# Patient Record
Sex: Female | Born: 1980 | Race: White | Hispanic: No | Smoking: Current every day smoker
Health system: Southern US, Community
[De-identification: ages and names within clinical notes are randomized; demographics above are authoritative.]

## PROBLEM LIST (undated history)

## (undated) DIAGNOSIS — K219 Gastro-esophageal reflux disease without esophagitis: Secondary | ICD-10-CM

## (undated) DIAGNOSIS — F32A Depression, unspecified: Secondary | ICD-10-CM

## (undated) DIAGNOSIS — M546 Pain in thoracic spine: Secondary | ICD-10-CM

## (undated) DIAGNOSIS — G8929 Other chronic pain: Secondary | ICD-10-CM

## (undated) DIAGNOSIS — F419 Anxiety disorder, unspecified: Secondary | ICD-10-CM

## (undated) DIAGNOSIS — J40 Bronchitis, not specified as acute or chronic: Secondary | ICD-10-CM

## (undated) DIAGNOSIS — F329 Major depressive disorder, single episode, unspecified: Secondary | ICD-10-CM

## (undated) DIAGNOSIS — G894 Chronic pain syndrome: Secondary | ICD-10-CM

## (undated) DIAGNOSIS — I10 Essential (primary) hypertension: Secondary | ICD-10-CM

## (undated) DIAGNOSIS — R569 Unspecified convulsions: Secondary | ICD-10-CM

## (undated) DIAGNOSIS — Z720 Tobacco use: Secondary | ICD-10-CM

## (undated) DIAGNOSIS — G43909 Migraine, unspecified, not intractable, without status migrainosus: Secondary | ICD-10-CM

## (undated) DIAGNOSIS — M1990 Unspecified osteoarthritis, unspecified site: Secondary | ICD-10-CM

## (undated) DIAGNOSIS — Z9114 Patient's other noncompliance with medication regimen: Secondary | ICD-10-CM

## (undated) DIAGNOSIS — M549 Dorsalgia, unspecified: Secondary | ICD-10-CM

## (undated) HISTORY — PX: TUBAL LIGATION: SHX77

## (undated) HISTORY — PX: KNEE ARTHROPLASTY: SHX992

## (undated) HISTORY — DX: Unspecified convulsions: R56.9

---

## 2010-01-22 ENCOUNTER — Encounter
Admission: RE | Admit: 2010-01-22 | Discharge: 2010-04-22 | Payer: Self-pay | Source: Home / Self Care | Admitting: Physician Assistant

## 2010-05-19 ENCOUNTER — Emergency Department (HOSPITAL_COMMUNITY): Admission: EM | Admit: 2010-05-19 | Discharge: 2010-05-19 | Payer: Self-pay | Admitting: Emergency Medicine

## 2010-11-03 ENCOUNTER — Emergency Department (HOSPITAL_COMMUNITY): Payer: Medicaid Other

## 2010-11-03 ENCOUNTER — Emergency Department (HOSPITAL_COMMUNITY)
Admission: EM | Admit: 2010-11-03 | Discharge: 2010-11-03 | Disposition: A | Discharge status: Home / Self Care | Payer: Medicaid Other | Attending: Emergency Medicine | Admitting: Emergency Medicine

## 2010-11-03 DIAGNOSIS — K219 Gastro-esophageal reflux disease without esophagitis: Secondary | ICD-10-CM | POA: Insufficient documentation

## 2010-11-03 DIAGNOSIS — S0003XA Contusion of scalp, initial encounter: Secondary | ICD-10-CM | POA: Insufficient documentation

## 2010-11-03 DIAGNOSIS — IMO0002 Reserved for concepts with insufficient information to code with codable children: Secondary | ICD-10-CM | POA: Insufficient documentation

## 2016-02-25 ENCOUNTER — Emergency Department (HOSPITAL_COMMUNITY)
Admission: EM | Admit: 2016-02-25 | Discharge: 2016-02-25 | Disposition: A | Discharge status: Home / Self Care | Payer: Medicaid Other | Attending: Emergency Medicine | Admitting: Emergency Medicine

## 2016-02-25 ENCOUNTER — Encounter (HOSPITAL_COMMUNITY): Payer: Self-pay

## 2016-02-25 DIAGNOSIS — J189 Pneumonia, unspecified organism: Secondary | ICD-10-CM

## 2016-02-25 DIAGNOSIS — Z79891 Long term (current) use of opiate analgesic: Secondary | ICD-10-CM | POA: Insufficient documentation

## 2016-02-25 DIAGNOSIS — F17210 Nicotine dependence, cigarettes, uncomplicated: Secondary | ICD-10-CM | POA: Diagnosis not present

## 2016-02-25 DIAGNOSIS — Z79899 Other long term (current) drug therapy: Secondary | ICD-10-CM | POA: Insufficient documentation

## 2016-02-25 DIAGNOSIS — M1990 Unspecified osteoarthritis, unspecified site: Secondary | ICD-10-CM | POA: Diagnosis not present

## 2016-02-25 DIAGNOSIS — M546 Pain in thoracic spine: Secondary | ICD-10-CM

## 2016-02-25 DIAGNOSIS — F329 Major depressive disorder, single episode, unspecified: Secondary | ICD-10-CM | POA: Insufficient documentation

## 2016-02-25 HISTORY — DX: Dorsalgia, unspecified: M54.9

## 2016-02-25 HISTORY — DX: Unspecified convulsions: R56.9

## 2016-02-25 HISTORY — DX: Major depressive disorder, single episode, unspecified: F32.9

## 2016-02-25 HISTORY — DX: Unspecified osteoarthritis, unspecified site: M19.90

## 2016-02-25 HISTORY — DX: Pain in thoracic spine: M54.6

## 2016-02-25 HISTORY — DX: Chronic pain syndrome: G89.4

## 2016-02-25 HISTORY — DX: Migraine, unspecified, not intractable, without status migrainosus: G43.909

## 2016-02-25 HISTORY — DX: Patient's other noncompliance with medication regimen: Z91.14

## 2016-02-25 HISTORY — DX: Depression, unspecified: F32.A

## 2016-02-25 HISTORY — DX: Bronchitis, not specified as acute or chronic: J40

## 2016-02-25 HISTORY — DX: Tobacco use: Z72.0

## 2016-02-25 HISTORY — DX: Gastro-esophageal reflux disease without esophagitis: K21.9

## 2016-02-25 HISTORY — DX: Other chronic pain: G89.29

## 2016-02-25 HISTORY — DX: Anxiety disorder, unspecified: F41.9

## 2016-02-25 MED ORDER — CYCLOBENZAPRINE HCL 5 MG PO TABS: 5.0000 mg | ORAL_TABLET | Freq: Three times a day (TID) | ORAL | Status: DC | PRN

## 2016-02-25 MED ORDER — KETOROLAC TROMETHAMINE 30 MG/ML IJ SOLN
60.0000 mg | Freq: Once | INTRAMUSCULAR | Status: AC
  Administered 2016-02-25: 60 mg via INTRAMUSCULAR
  Filled 2016-02-25: qty 2

## 2016-02-25 MED ORDER — KETOROLAC TROMETHAMINE 30 MG/ML IJ SOLN
INTRAMUSCULAR | Status: AC
  Filled 2016-02-25: qty 1

## 2016-02-25 MED ORDER — NAPROXEN 500 MG PO TABS: ORAL_TABLET | ORAL | Status: DC

## 2016-02-25 NOTE — Discharge Instructions (Signed)
Continue using ice and heat on your back. You need to start your antibiotics. Your CAT scan was concerning for possible infection in your lung. You can take Mucinex DM over-the-counter for cough. Take the medications as prescribed. You need to be following up with your pain management doctor about your upper back pain. You should have pain medication to take, however,  if you have run out early you will need to contact Dr. Estella Huskunheim.

## 2016-02-25 NOTE — ED Notes (Signed)
Patient has prescriptions for prednisone and Zithromax

## 2016-02-25 NOTE — ED Notes (Signed)
Dropped one toradol 30 in the floor , it broke and then withdrew a second through an override

## 2016-02-25 NOTE — ED Notes (Signed)
Pt request a work excuse as the one from [facility] covered only one day

## 2016-02-25 NOTE — ED Notes (Signed)
Pt presents with 2 discharge summaries with prescriptions attached from [facility] dated 6/13 and 6/23. She has been diagnosed with thoracic strain and has prescriptions for prednisone dosepack as well as zithromax dosepack. She reports that she works nights, has worked "7 nights a week" and has not been able to ee her primary care physician nor the other physicians who follow her other reported conditions. She ambulates heel to toe without stagger or drift, answers questions slowly  Her prescriptions have a tag under the staple and when asked, pt reports that she had no other prescription, but that she tore off her work excuse

## 2016-02-25 NOTE — ED Provider Notes (Signed)
CSN: [id number]     Arrival date & time 02/25/16  0436 History   First MD Initiated Contact with Patient 02/25/16 0513  AM   Chief Complaint  Patient presents with  . Back Pain     (Consider location/radiation/quality/duration/timing/severity/associated sxs/prior Treatment) HPI patient reports she's been having pain in her upper back since about June 7. She denies any known injury. She denies any change in her activity. She states the pain is constant and sharp and has started radiating around the left side of her chest she feels shortness of breath at times. She states she's had a cough however she tries to suppress the cough because it hurts to cough. Office dry and without fever. She denies sore throat, rhinorrhea, or wheezing. She states taking big deep breaths, sneezing, coughing, movement such as twisting her body or moving her arms makes the pain worse. Nothing makes it feel better. She states she has tried icy hot, ice, heat, and Biofreeze which she had left over for treatment of a prior low back problem and knee pain. She has been seen [facility] on June 13 and at that time was prescribed diclofenac and Flexeril. She returned on the 23rd and had a CT angiogram of her chest and lab work done. She was prescribed a Z-Pak which she has not filled yet. She states tonight at work she started having pain. Patient states she works in [place] and all of [place] doctors are in [place] except for her primary care doctor.Patient is not on birth control periods, coming that she has had a BTL for birth control.    PCP [facility] Dr Estella Huskunheim, pain management [place], [place]  Past Medical History  Diagnosis Date  . Seizures (HCC)   . Migraines   . Non compliance w medication regimen   . Bronchitis   . Tobacco abuse   . Chronic back pain   . Chronic pain disorder   . Thoracic back pain   . GERD (gastroesophageal reflux disease)   . Arthritis   . Anxiety   .  Depression    Past Surgical History  Procedure Laterality Date  . Back surgery     No family history on file. Social History  Substance Use Topics  . Smoking status: Current Every Day Smoker -- 0.50 packs/day    Types: Cigarettes  . Smokeless tobacco: None  . Alcohol Use: No  employed  OB History    No data available     Review of Systems  All other systems reviewed and are negative.     Allergies  Review of patient's allergies indicates no known allergies.  Home Medications   Prior to Admission medications   Medication Sig Start Date End Date Taking? Authorizing Provider  alprazolam Prudy Feeler(XANAX) 2 MG tablet Take 2 mg by mouth 3 (three) times daily.   Yes Historical Provider, MD  ARIPiprazole (ABILIFY) 20 MG tablet Take 20 mg by mouth daily.   Yes Historical Provider, MD  cholecalciferol (D-VI-SOL) 400 UNIT/ML LIQD Take 400 Units by mouth daily.   Yes Historical Provider, MD  fentaNYL (DURAGESIC - DOSED MCG/HR) 50 MCG/HR Place 50 mcg onto the skin every 3 (three) days.   Yes Historical Provider, MD  FLUoxetine (PROZAC) 20 MG capsule Take 20 mg by mouth daily.   Yes Historical Provider, MD  hydrocortisone cream 1 % Apply 1 application topically daily.   Yes Historical Provider, MD  lamoTRIgine (LAMICTAL) 100 MG tablet Take 200 mg by mouth  2 (two) times daily.   Yes Historical Provider, MD  meloxicam (MOBIC) 7.5 MG tablet Take 7.5 mg by mouth daily.   Yes Historical Provider, MD  omeprazole (PRILOSEC) 20 MG capsule Take 20 mg by mouth daily.   Yes Historical Provider, MD  oxycodone (ROXICODONE) 30 MG immediate release tablet Take 20 mg by mouth every 4 (four) hours as needed for pain.   Yes Historical Provider, MD  predniSONE (DELTASONE) 20 MG tablet Take 20 mg by mouth as directed. #20 prescribed, taper dosepak   Yes Historical Provider, MD  pregabalin (LYRICA) 200 MG capsule Take 200 mg by mouth 3 (three) times daily.   Yes Historical Provider, MD  topiramate (TOPAMAX) 50 MG  tablet Take 50 mg by mouth 2 (two) times daily.   Yes Historical Provider, MD  cyclobenzaprine (FLEXERIL) 5 MG tablet Take 1 tablet (5 mg total) by mouth 3 (three) times daily as needed. 02/25/16   Devoria AlbeIva Gunner Iodice, MD  naproxen (NAPROSYN) 500 MG tablet Take 1 po BID with food prn pain 02/25/16   Devoria AlbeIva Paxton Kanaan, MD   BP 116/67 mmHg  Pulse 68  Temp(Src) 97.7 F (36.5 C) (Oral)  Resp 18  Ht 5\' 7"  (1.702 m)  Wt 165 lb (74.844 kg)  BMI 25.84 kg/m2  SpO2 100% Physical Exam  Constitutional: She is oriented to person, place, and time. She appears well-developed and well-nourished.  Non-toxic appearance. She does not appear ill. No distress.  Patient sleeping, patient's speech is noted to be slow.  HENT:  Head: Normocephalic and atraumatic.  Right Ear: External ear normal.  Left Ear: External ear normal.  Nose: Nose normal. No mucosal edema or rhinorrhea.  Mouth/Throat: Oropharynx is clear and moist and mucous membranes are normal. No dental abscesses or uvula swelling.  Eyes: Conjunctivae and EOM are normal. Pupils are equal, round, and reactive to light.  Neck: Normal range of motion and full passive range of motion without pain. Neck supple.  Cardiovascular: Normal rate, regular rhythm and normal heart sounds.  Exam reveals no gallop and no friction rub.   No murmur heard. Pulmonary/Chest: Effort normal and breath sounds normal. No respiratory distress. She has no wheezes. She has no rhonchi. She has no rales.   She exhibits no tenderness and no crepitus.  Area of pain noted  Abdominal: Soft. Normal appearance and bowel sounds are normal. She exhibits no distension. There is no tenderness. There is no rebound and no guarding.  Musculoskeletal: Normal range of motion. She exhibits no edema or tenderness.  Moves all extremities well.   Neurological: She is alert and oriented to person, place, and time. She has normal strength. No cranial nerve deficit.  Skin: Skin is warm, dry and intact. No rash noted.  No erythema. No pallor.  Psychiatric: She has a normal mood and affect. Her speech is normal and behavior is normal. Her mood appears not anxious.  Nursing note and vitals reviewed.   ED Course  Procedures (including critical care time)  Medications  ketorolac (TORADOL) 30 MG/ML injection (not administered)  ketorolac (TORADOL) 30 MG/ML injection 60 mg (60 mg Intramuscular Given 02/25/16 0549)     Patient drove herself to the ED and she has to drive home. She was given Toradol 60 mg IM.  On patient's discharge summary [facility] yesterday she had a normal CBC and a normal BMET except for a low potassium.  Radiology techs have found her chest  CT scan done at [facility] yesterday. They state  it was negative for pulmonary embolus, however there was some ground glass appearance in her lungs which explains why they started her on the Zithromax for presumed pneumonitis.This information was passed on to the patient.  Review of the [place] shows patient was dispensed #15 fentanyl 50 g per hour patches on June 7, #90 Lyrica 200 mg capsules on June 6, #90 alprazolam 2 mg tablets on June 5, and #90 oxycodone 10 mg tablets on May 31. These have been prescribed on a monthly basis from Dr.Runheim   Final diagnoses:  CAP (community acquired pneumonia)  Left-sided thoracic back pain    New Prescriptions   CYCLOBENZAPRINE (FLEXERIL) 5 MG TABLET    Take 1 tablet (5 mg total) by mouth 3 (three) times daily as needed.   NAPROXEN (NAPROSYN) 500 MG TABLET    Take 1 po BID with food prn pain  Patient already has a prescription for a Z-Pak she has not filled. She does not have a fever in the ED.  Plan discharge  Devoria Albe, MD, Concha Pyo, MD 02/25/16 [phone]

## 2016-02-25 NOTE — ED Notes (Signed)
I am having pain in my upper back at my neck and pain in left side of back coming into my chest.  I have been seen at [facility] for the same problem.

## 2016-10-22 ENCOUNTER — Encounter (HOSPITAL_COMMUNITY): Payer: Self-pay | Admitting: *Deleted

## 2016-10-22 ENCOUNTER — Emergency Department (HOSPITAL_COMMUNITY)
Admission: EM | Admit: 2016-10-22 | Discharge: 2016-10-22 | Disposition: A | Discharge status: Home / Self Care | Payer: Medicaid Other | Attending: Emergency Medicine | Admitting: Emergency Medicine

## 2016-10-22 ENCOUNTER — Emergency Department (HOSPITAL_COMMUNITY): Payer: Medicaid Other

## 2016-10-22 DIAGNOSIS — R072 Precordial pain: Secondary | ICD-10-CM | POA: Insufficient documentation

## 2016-10-22 DIAGNOSIS — F17210 Nicotine dependence, cigarettes, uncomplicated: Secondary | ICD-10-CM | POA: Diagnosis not present

## 2016-10-22 DIAGNOSIS — R079 Chest pain, unspecified: Secondary | ICD-10-CM

## 2016-10-22 LAB — I-STAT BETA HCG BLOOD, ED (MC, WL, AP ONLY): I-stat hCG, quantitative: <5 m[IU]/mL (ref <5)

## 2016-10-22 LAB — BASIC METABOLIC PANEL
ANION GAP: 10 (ref 5–15)
BUN: 8 mg/dL (ref 6–20)
CALCIUM: 9.4 mg/dL (ref 8.9–10.3)
CO2: 20 mmol/L — AB (ref 22–32)
Chloride: 108 mmol/L (ref 101–111)
Creatinine, Ser: 0.82 mg/dL (ref 0.44–1.00)
GFR calc Af Amer: >60 mL/min (ref >60)
GFR calc non Af Amer: >60 mL/min (ref >60)
GLUCOSE: 114 mg/dL — AB (ref 65–99)
Potassium: 3.5 mmol/L (ref 3.5–5.1)
Sodium: 138 mmol/L (ref 135–145)

## 2016-10-22 LAB — CBC
HEMATOCRIT: 40.7 % (ref 36.0–46.0)
HEMOGLOBIN: 13.6 g/dL (ref 12.0–15.0)
MCH: 31.6 pg (ref 26.0–34.0)
MCHC: 33.4 g/dL (ref 30.0–36.0)
MCV: 94.4 fL (ref 78.0–100.0)
Platelets: 261 10*3/uL (ref 150–400)
RBC: 4.31 MIL/uL (ref 3.87–5.11)
RDW: 13.3 % (ref 11.5–15.5)
WBC: 12.9 10*3/uL — ABNORMAL HIGH (ref 4.0–10.5)

## 2016-10-22 LAB — I-STAT TROPONIN, ED
TROPONIN I, POC: 0 ng/mL (ref 0.00–0.08)
Troponin i, poc: 0 ng/mL (ref 0.00–0.08)

## 2016-10-22 MED ORDER — CYCLOBENZAPRINE HCL 10 MG PO TABS: 10.0000 mg | ORAL_TABLET | Freq: Two times a day (BID) | ORAL | 0 refills | Status: DC | PRN

## 2016-10-22 NOTE — ED Triage Notes (Signed)
Pt reports mid chest discomfort. Denies recent cough. No resp distress noted, ekg done at triage.

## 2016-10-22 NOTE — ED Provider Notes (Addendum)
[facility]-EMERGENCY DEPT Provider Note   CSN: [id number] Arrival date & time: 10/22/16  1201   By signing my name below, I, Soijett Blue, attest that this documentation has been prepared under the direction and in the presence of Alvira MondayErin Braysen Cloward, MD. Electronically Signed: Soijett Blue, ED Scribe. 10/22/16. 1:34 PM.  History   Chief Complaint Chief Complaint  Patient presents with  . Chest Pain    HPI Heidi Kelly is a 36 y.o. female who presents to the Emergency Department complaining of gradually worsening, sharp, substernal CP onset 2 months worsening 5 hours ago. Pt reports associated tingling to left sided body, SOB, nausea, vomiting, cough x 1 month, and subjective fever. Pt hasn't tried any medications for the relief of her symptoms. Pt notes that her substernal CP will intermittently radiate to her left axilla. Pt states that her substernal CP is constant with movement and mildly alleviated with laying down and applying pressure. Pt reports that she was evaluated at her PCP for her substernal CP 1 month ago with a negative workup. Pt states that she hasn't been evaluated by a cardiologist for her symptoms. She denies any other symptoms. Pt has family hx of heart disease and her mother was [phone] when she had a MI. Pt notes that she smokes cigarettes. Pt has had a tubal ligation. Pt denies recent travel, immobilization, surgery, or birth control use.     The history is provided by the patient. No language interpreter was used.    Past Medical History:  Diagnosis Date  . Anxiety   . Arthritis   . Bronchitis   . Chronic back pain   . Chronic pain disorder   . Depression   . GERD (gastroesophageal reflux disease)   . Migraines   . Non compliance w medication regimen   . Seizures (HCC)   . Thoracic back pain   . Tobacco abuse     There are no active problems to display for this patient.   Past Surgical History:  Procedure Laterality Date  . BACK SURGERY      OB History    No data available       Home Medications    Prior to Admission medications   Medication Sig Start Date End Date Taking? Authorizing Provider  alprazolam Prudy Feeler(XANAX) 2 MG tablet Take 2 mg by mouth 3 (three) times daily.    Historical Provider, MD  ARIPiprazole (ABILIFY) 20 MG tablet Take 20 mg by mouth daily.    Historical Provider, MD  cholecalciferol (D-VI-SOL) 400 UNIT/ML LIQD Take 400 Units by mouth daily.    Historical Provider, MD  cyclobenzaprine (FLEXERIL) 5 MG tablet Take 1 tablet (5 mg total) by mouth 3 (three) times daily as needed. 02/25/16   Devoria AlbeIva Knapp, MD  fentaNYL (DURAGESIC - DOSED MCG/HR) 50 MCG/HR Place 50 mcg onto the skin every 3 (three) days.    Historical Provider, MD  FLUoxetine (PROZAC) 20 MG capsule Take 20 mg by mouth daily.    Historical Provider, MD  hydrocortisone cream 1 % Apply 1 application topically daily.    Historical Provider, MD  lamoTRIgine (LAMICTAL) 100 MG tablet Take 200 mg by mouth 2 (two) times daily.    Historical Provider, MD  meloxicam (MOBIC) 7.5 MG tablet Take 7.5 mg by mouth daily.    Historical Provider, MD  naproxen (NAPROSYN) 500 MG tablet Take 1 po BID with food prn pain 02/25/16   Devoria AlbeIva Knapp, MD  omeprazole (PRILOSEC) 20 MG capsule Take 20 mg by  mouth daily.    Historical Provider, MD  oxycodone (ROXICODONE) 30 MG immediate release tablet Take 20 mg by mouth every 4 (four) hours as needed for pain.    Historical Provider, MD  predniSONE (DELTASONE) 20 MG tablet Take 20 mg by mouth as directed. #20 prescribed, taper dosepak    Historical Provider, MD  pregabalin (LYRICA) 200 MG capsule Take 200 mg by mouth 3 (three) times daily.    Historical Provider, MD  topiramate (TOPAMAX) 50 MG tablet Take 50 mg by mouth 2 (two) times daily.    Historical Provider, MD    Family History History reviewed. No pertinent family history.  Social History Social History  Substance Use Topics  . Smoking status: Current Every Day Smoker    Packs/day: 0.50     Types: Cigarettes  . Smokeless tobacco: Not on file  . Alcohol use No     Allergies   Patient has no known allergies.   Review of Systems Review of Systems  Constitutional: Positive for fever (subjective).  HENT: Negative for sore throat.   Eyes: Negative for visual disturbance.  Respiratory: Positive for cough and shortness of breath.   Cardiovascular: Positive for chest pain (substernal). Negative for leg swelling.  Gastrointestinal: Positive for nausea and vomiting. Negative for abdominal pain.  Genitourinary: Negative for difficulty urinating.  Musculoskeletal: Negative for back pain and neck pain.  Skin: Negative for rash.  Neurological: Negative for syncope and headaches.       +tingling to left arm at times, occasionally left leg     Physical Exam Updated Vital Signs BP (!) 91/54 (BP Location: Right Arm)   Pulse 69   Temp 98.7 F (37.1 C) (Oral)   Resp 20   LMP 09/29/2016   SpO2 98%   Physical Exam  Constitutional: She is oriented to person, place, and time. She appears well-developed and well-nourished. No distress.  HENT:  Head: Normocephalic and atraumatic.  Eyes: EOM are normal.  Neck: Neck supple.  Cardiovascular: Normal rate, regular rhythm and normal heart sounds.  Exam reveals no gallop and no friction rub.   No murmur heard. Pulmonary/Chest: Effort normal and breath sounds normal. No respiratory distress. She has no wheezes. She has no rales. She exhibits tenderness.  Abdominal: Soft. She exhibits no distension. There is no tenderness.  Musculoskeletal: Normal range of motion.  Neurological: She is alert and oriented to person, place, and time.  Skin: Skin is warm and dry.  Psychiatric: She has a normal mood and affect. Her behavior is normal.  Nursing note and vitals reviewed.    ED Treatments / Results  DIAGNOSTIC STUDIES: Oxygen Saturation is 100% on RA, nl by my interpretation.    COORDINATION OF CARE: 1:31 PM Discussed treatment plan  with pt at bedside which includes labs, EKG, CXR, and pt agreed to plan.   Labs (all labs ordered are listed, but only abnormal results are displayed) Labs Reviewed  BASIC METABOLIC PANEL - Abnormal; Notable for the following:       Result Value   CO2 20 (*)    Glucose, Bld 114 (*)    All other components within normal limits  CBC - Abnormal; Notable for the following:    WBC 12.9 (*)    All other components within normal limits  Rosezena Sensor, ED  I-STAT BETA HCG BLOOD, ED ([facility], [facility], [facility] ONLY)  Rosezena Sensor, ED    EKG  EKG Interpretation  Date/Time:  Tuesday October 22 2016 12:05:26 EST Ventricular  Rate:  86 PR Interval:  158 QRS Duration: 82 QT Interval:  368 QTC Calculation: 440 R Axis:   85 Text Interpretation:  Normal sinus rhythm Normal ECG No previous ECGs available Confirmed by [facility] MD, Jacarius Handel ([id number]) on 10/22/2016 12:52:28 PM       Radiology Dg Chest 2 View  Result Date: 10/22/2016 CLINICAL DATA:  Chest pain and shortness of breath for 2 months, worsening. EXAM: CHEST  2 VIEW COMPARISON:  None. FINDINGS: The chest is hyperexpanded with some attenuation of the pulmonary vasculature suggestive of COPD. No consolidative process, pneumothorax or effusion. Heart size is normal. No acute bony abnormality. IMPRESSION: Findings suggestive of COPD.  No acute disease. Electronically Signed   By: Drusilla Kanner M.D.   On: 10/22/2016 14:27    Procedures Procedures (including critical care time)  Medications Ordered in ED Medications - No data to display   Initial Impression / Assessment and Plan / ED Course  I have reviewed the triage vital signs and the nursing notes.  Pertinent labs & imaging results that were available during my care of the patient were reviewed by me and considered in my medical decision making (see chart for details).     36 year old female with a history of smoking, family history of cardiovascular disease, chronic pain, presents  with concern for chest pain for one year.  Differential diagnosis for chest pain includes pulmonary embolus, dissection, pneumothorax, pneumonia, ACS, myocarditis, pericarditis.  EKG was done and evaluate by me and showed no acute ST changes and no signs of pericarditis. Chest x-ray was done and evaluated by me and radiology and showed no sign of pneumonia or pneumothorax. Patient is PERC negative and low risk [place] and have low suspicion for PE.  Patient is low risk HEART score and had delta troponins which were both negative. Given this evaluation, history and physical have low suspicion for pulmonary embolus, pneumonia, ACS, myocarditis, pericarditis, dissection.   Given family history, hx of smoking, exertional component, feel outpatient Cardiology evaluation is appropriate. Will provide number for Cardiology.  Final Clinical Impressions(s) / ED Diagnoses   Final diagnoses:  Nonspecific chest pain    New Prescriptions New Prescriptions   No medications on file   I personally performed the services described in this documentation, which was scribed in my presence. The recorded information has been reviewed and is accurate.     Alvira Monday, MD 10/22/16 1622    Alvira Monday, MD 10/22/16 [phone]

## 2016-10-28 ENCOUNTER — Telehealth: Payer: Self-pay | Admitting: Physician Assistant

## 2016-10-28 NOTE — Telephone Encounter (Signed)
10/28/2016 Re[facility] for upcoming appointment with Theodore Demarkhonda Barrett, PA- C on 10/31/2016. [facility]  cbr

## 2016-10-31 ENCOUNTER — Encounter: Payer: Self-pay | Admitting: Physician Assistant

## 2016-10-31 ENCOUNTER — Ambulatory Visit (INDEPENDENT_AMBULATORY_CARE_PROVIDER_SITE_OTHER): Payer: Medicaid Other | Admitting: Physician Assistant

## 2016-10-31 VITALS — BP 103/66 | HR 82 | Ht 67.0 in | Wt 204.6 lb

## 2016-10-31 DIAGNOSIS — M5440 Lumbago with sciatica, unspecified side: Secondary | ICD-10-CM | POA: Diagnosis not present

## 2016-10-31 DIAGNOSIS — R079 Chest pain, unspecified: Secondary | ICD-10-CM

## 2016-10-31 DIAGNOSIS — G8929 Other chronic pain: Secondary | ICD-10-CM

## 2016-10-31 NOTE — Progress Notes (Signed)
Cardiology Office Note   Date:  10/31/2016   ID:  Heidi Kelly, DOB 1980/10/01, MRN [id number]  PCP:  Pcp Not In System  Cardiologist:  New, will be Dr Cristobal Goldmann, Bjorn Loser, PA-C    History of Present Illness: Heidi Kelly is a 36 y.o. female with a history of anxiety, GERD, depression, migraines, seizures, tobacco use, chronic pain issues. Mother had CHF at age 44, no hx stenting. Aunt had OHS  10/22/2016 ER visit for chest pain that was sharp and started 2 months ago. Records sent from PCP.  Heidi Kelly presents for cardiology evaluation.  She has been having chest pain for at least a couple of months. It is sharp and stabbing. Mid sternum, upper L chest and L arm. Occasional paresthesias in her L arm. It can reach a 9/10 when she is up and moving. She is at a 5/10 right now. Worse with deep inspiration or cough. She helps the pain by lying down and squeezing her arms together. Her daily meds, including Lyrica, oxycodone and Mobic do not help. She wakes with the pain every day, it never goes away. It is affecting her life, her activity level is decreased because of the pain.   She also has chronic back pain, thoracic to lumbar area. She takes shots for upper back pain that goes into her neck. She has occasional numbness in her legs.   She will occasionally get swelling her legs, feet and hands. She is eating less salt, the swelling has improved. She woke up with swelling about a week ago. She gets periodic cortisone shots in her neck, the swelling may be worse after the shot.   Past Medical History:  Diagnosis Date  . Anxiety   . Arthritis   . Bronchitis   . Chronic back pain   . Chronic pain disorder   . Depression   . GERD (gastroesophageal reflux disease)   . Migraines   . Non compliance w medication regimen   . Seizure (HCC)    simce she was 20  . Seizures (HCC)   . Thoracic back pain   . Tobacco abuse     Past Surgical History:  Procedure Laterality Date    . BACK SURGERY      Current Outpatient Prescriptions  Medication Sig Dispense Refill  . alprazolam (XANAX) 2 MG tablet Take 2 mg by mouth 3 (three) times daily.    . ARIPiprazole (ABILIFY) 20 MG tablet Take 20 mg by mouth daily.    . cholecalciferol (D-VI-SOL) 400 UNIT/ML LIQD Take 400 Units by mouth daily.    . fentaNYL (DURAGESIC - DOSED MCG/HR) 50 MCG/HR Place 50 mcg onto the skin every 3 (three) days.    Marland Kitchen lamoTRIgine (LAMICTAL) 100 MG tablet Take 200 mg by mouth 2 (two) times daily.    . meloxicam (MOBIC) 7.5 MG tablet Take 7.5 mg by mouth daily.    Marland Kitchen omeprazole (PRILOSEC) 20 MG capsule Take 20 mg by mouth daily.    Marland Kitchen oxycodone (ROXICODONE) 30 MG immediate release tablet Take 20 mg by mouth every 4 (four) hours as needed for pain.    . pregabalin (LYRICA) 200 MG capsule Take 200 mg by mouth 3 (three) times daily.    Marland Kitchen topiramate (TOPAMAX) 50 MG tablet Take 50 mg by mouth 2 (two) times daily.     No current facility-administered medications for this visit.     Allergies:   Patient has no known allergies.    Social  History:  The patient  reports that she has been smoking Cigarettes.  She has been smoking about 0.50 packs per day. She has never used smokeless tobacco. She reports that she does not drink alcohol or use drugs.   Family History:  The patient's family history includes Congestive Heart Failure in her mother; Diabetes in her maternal grandmother and mother; Heart disease in her mother; Hyperlipidemia in her mother; Hypertension in her maternal grandmother and mother.    ROS:  Please see the history of present illness. All other systems are reviewed and negative.    PHYSICAL EXAM: VS:  BP 103/66   Pulse 82   Ht 5\' 7"  (1.702 m)   Wt 204 lb 9.6 oz (92.8 kg)   SpO2 98%   BMI 32.04 kg/m  , BMI Body mass index is 32.04 kg/m. GEN: Well nourished, well developed, female in no acute distress  HEENT: normal for age  Neck: no JVD, no carotid bruit, no masses Cardiac:  RRR; no murmur, no rubs, or gallops Respiratory:  Slightly decreased BS bases bilaterally but clear, normal work of breathing GI: soft, nontender, nondistended, + BS MS: no deformity or atrophy; no edema; distal pulses are 2+ in all 4 extremities   Skin: warm and dry, no rash Neuro:  Strength and sensation are intact Psych: euthymic mood, full affect   EKG:  EKG is ordered today. The ekg ordered today demonstrates SR, HR 83. Normal intervals, no ischemic changes   Recent Labs: 10/22/2016: BUN 8; Creatinine, Ser 0.82; Hemoglobin 13.6; Platelets 261; Potassium 3.5; Sodium 138    Lipid Panel No results found for: CHOL, TRIG, HDL, CHOLHDL, VLDL, LDLCALC, LDLDIRECT   Wt Readings from Last 3 Encounters:  10/31/16 204 lb 9.6 oz (92.8 kg)  02/25/16 165 lb (74.8 kg)     Other studies Reviewed: Additional studies/ records that were reviewed today include: office notes faxed over, hospital records and testing.  ASSESSMENT AND PLAN: The patient and the plan were reviewed with Dr Herbie BaltimoreHarding who agrees  1.  Chest pain, moderate CAD risk: atypical in nature, but also with exertional component. Neither NSAIDs or narcotics are helping. Her major CRF is FH of CHF/CAD in multiple female relatives.   Because she has multiple other pain issues, I feel a definitive answer is needed. Cardiac CT is therefore the best test for her. We will order this. Follow up after the test can be scheduled or prn if it is positive.  No med changes, she is already on pain control meds and NSAIDs. No med changes. If the test is negative. follow up with her PCP for pain control.  Current medicines are reviewed at length with the patient today.  The patient does not have concerns regarding medicines.  The following changes have been made:  no change  Labs/ tests ordered today include:  No orders of the defined types were placed in this encounter.  Disposition:   FU with Dr Herbie BaltimoreHarding  Signed, Theodore DemarkBarrett, Ariea Rochin, PA-C   10/31/2016 11:11 AM    [facility] Phone: [phone]; Fax: [phone]  This note was written with the assistance of speech recognition software. Please excuse any transcriptional errors.

## 2016-10-31 NOTE — Patient Instructions (Signed)
Medication Instructions:  Your physician recommends that you continue on your current medications as directed. Please refer to the Current Medication list given to you today.  Labwork: NONE   Testing/Procedures: Your physician has requested that you have cardiac CT. Cardiac computed tomography (CT) is a painless test that uses an x-ray machine to take clear, detailed pictures of your heart. For further information please visit [email or web address]. Please follow instruction sheet as given.   Follow-Up: Your physician recommends that you schedule a follow-up appointment in: WITH RHONDA OR PRN    Any Other Special Instructions Will Be Listed Below (If Applicable). WE WILL CONTACT WITH THE CT RESULTS  If you need a refill on your cardiac medications before your next appointment, please call your pharmacy.

## 2016-11-04 ENCOUNTER — Telehealth: Payer: Self-pay | Admitting: Physician Assistant

## 2016-11-04 ENCOUNTER — Encounter: Payer: Self-pay | Admitting: Physician Assistant

## 2016-11-04 NOTE — Telephone Encounter (Signed)
Returned call, no answer when dialed. 

## 2016-11-04 NOTE — Telephone Encounter (Signed)
Attempted to reach pt , VM box not set up

## 2016-11-04 NOTE — Telephone Encounter (Signed)
2nd attempt to reach patient, also goes to VM box not set up to receive msg.

## 2016-11-04 NOTE — Telephone Encounter (Signed)
FU Dr Herbie BaltimoreHarding as scheduled; if needs to be seen can arrange PAOV Olga MillersBrian Crenshaw

## 2016-11-04 NOTE — Telephone Encounter (Signed)
New message    Pt c/o swelling: STAT is pt has developed SOB within 24 hours  1. How long have you been experiencing swelling? 2-3 days  2. Where is the swelling located? Legs and feet and hands  3.  Are you currently taking a "fluid pill" no  4.  Are you currently SOB? no  5.  Have you traveled recently? No  Pt states having pain in back and chest pain, she is having visions issues and a lot of swelling in legs and feet some in hand

## 2016-11-04 NOTE — Telephone Encounter (Signed)
Pt to establish w Dr. Herbie BaltimoreHarding Seen by Bjorn Loserhonda on 3/1 post-ED visit  Pending CT morph (ordered, not yet scheduled)  Returned call to patient. She notes swelling in hands and feet x2-3 days. Notes this has been ongoing before - discussed at OV on 3/1 along w her recurrent chest and back pain. She's avoiding salt.  She gets this swelling from time to time, usually self resolves after a day or two.  Not currently on a diuretic  She voices that she has also had return of intermittent blurry vision x2 days. This was not discussed at visit, but she notes she had this in the past. Recurrent in the past, but had gone away in the previous month - just returned over the weekend. Pt voices some anxiety. She is taking xanax for this. Also takes PRN pain meds.  Pt voices HR of 101 on pulse ox, spO298% No BP readings - She does not have home BP cuff.  Pt aware I'll call her back w recommendations after provider review.  Will also send request to schedulers to arrange CT  Routed to [facility].

## 2016-11-04 NOTE — Telephone Encounter (Signed)
Follow up ° °Pt returning nurses call. ° °Please f/u °

## 2016-11-05 NOTE — Telephone Encounter (Signed)
Pt informed of recommendations, voiced understanding and thanks.

## 2016-11-07 ENCOUNTER — Encounter (HOSPITAL_COMMUNITY): Payer: Self-pay | Admitting: Emergency Medicine

## 2016-11-07 ENCOUNTER — Ambulatory Visit (HOSPITAL_COMMUNITY): Admission: RE | Admit: 2016-11-07 | Payer: Medicaid Other | Source: Ambulatory Visit

## 2016-11-07 ENCOUNTER — Emergency Department (HOSPITAL_COMMUNITY)
Admission: EM | Admit: 2016-11-07 | Discharge: 2016-11-07 | Disposition: A | Discharge status: Home / Self Care | Payer: Medicaid Other | Attending: Dermatology | Admitting: Dermatology

## 2016-11-07 ENCOUNTER — Emergency Department (HOSPITAL_COMMUNITY): Payer: Medicaid Other

## 2016-11-07 DIAGNOSIS — R0602 Shortness of breath: Secondary | ICD-10-CM | POA: Diagnosis present

## 2016-11-07 DIAGNOSIS — F17210 Nicotine dependence, cigarettes, uncomplicated: Secondary | ICD-10-CM | POA: Insufficient documentation

## 2016-11-07 DIAGNOSIS — Z5321 Procedure and treatment not carried out due to patient leaving prior to being seen by health care provider: Secondary | ICD-10-CM | POA: Insufficient documentation

## 2016-11-07 NOTE — ED Notes (Signed)
Pt decided to leave per registration.  Pt said she saw a ambulance come in and was not waiting anymore.

## 2016-11-07 NOTE — ED Triage Notes (Signed)
PT c/o generalized pain with coughing and SOB on exertion x4 days.

## 2016-11-13 ENCOUNTER — Encounter: Payer: Self-pay | Admitting: Physician Assistant

## 2016-11-26 ENCOUNTER — Encounter (HOSPITAL_COMMUNITY): Payer: Self-pay

## 2016-11-26 ENCOUNTER — Ambulatory Visit (HOSPITAL_COMMUNITY)
Admission: RE | Admit: 2016-11-26 | Discharge: 2016-11-26 | Disposition: A | Discharge status: Home / Self Care | Payer: Medicaid Other | Source: Ambulatory Visit | Attending: Physician Assistant | Admitting: Physician Assistant

## 2016-11-26 DIAGNOSIS — M5440 Lumbago with sciatica, unspecified side: Secondary | ICD-10-CM | POA: Diagnosis not present

## 2016-11-26 DIAGNOSIS — R079 Chest pain, unspecified: Secondary | ICD-10-CM | POA: Insufficient documentation

## 2016-11-26 DIAGNOSIS — G8929 Other chronic pain: Secondary | ICD-10-CM | POA: Insufficient documentation

## 2016-11-26 DIAGNOSIS — R911 Solitary pulmonary nodule: Secondary | ICD-10-CM | POA: Insufficient documentation

## 2016-11-26 HISTORY — DX: Essential (primary) hypertension: I10

## 2016-11-26 MED ORDER — METOPROLOL TARTRATE 5 MG/5ML IV SOLN
INTRAVENOUS | Status: AC
  Filled 2016-11-26: qty 10

## 2016-11-26 MED ORDER — METOPROLOL TARTRATE 5 MG/5ML IV SOLN
5.0000 mg | INTRAVENOUS | Status: DC | PRN
  Administered 2016-11-26: 5 mg via INTRAVENOUS
  Filled 2016-11-26 (×2): qty 5

## 2016-11-26 MED ORDER — NITROGLYCERIN 0.4 MG SL SUBL
SUBLINGUAL_TABLET | SUBLINGUAL | Status: AC
  Filled 2016-11-26: qty 2

## 2016-11-26 MED ORDER — NITROGLYCERIN 0.4 MG SL SUBL
0.8000 mg | SUBLINGUAL_TABLET | Freq: Once | SUBLINGUAL | Status: AC
  Administered 2016-11-26: 0.8 mg via SUBLINGUAL
  Filled 2016-11-26: qty 25

## 2016-11-26 MED ORDER — IOPAMIDOL (ISOVUE-370) INJECTION 76%
INTRAVENOUS | Status: AC
  Administered 2016-11-26: 80 mL
  Filled 2016-11-26: qty 100

## 2016-11-26 NOTE — Progress Notes (Signed)
CT scan completed. Tolerated well. D/C home walking with companion. Awake and alert. In no distress.

## 2016-11-27 ENCOUNTER — Telehealth: Payer: Self-pay | Admitting: Physician Assistant

## 2016-11-27 NOTE — Telephone Encounter (Signed)
Returned call, patient aware CT morph results pending, I will call her back when this has been read.

## 2016-11-27 NOTE — Telephone Encounter (Signed)
-----   Message from Darrol Jumphonda G Barrett, PA-C sent at 11/27/2016  4:25 PM EDT ----- Pt to f/u with Dr Herbie BaltimoreHarding Please let her know her cardiac CT was fine, no sig CAD and calcium score 0. If she wants to keep f/u ok, if not, can see prn. Thanks

## 2016-11-27 NOTE — Telephone Encounter (Signed)
Left msg to call.

## 2016-11-27 NOTE — Telephone Encounter (Signed)
Pt advised results normal. We discussed and she decided she will, at this time, follow up w/ PCP for further recommendations. She's aware to call if new concerns/questions.

## 2016-11-27 NOTE — Telephone Encounter (Signed)
Patient calling for CT results, Thanks.

## 2016-11-27 NOTE — Telephone Encounter (Signed)
New message    pt verbalized that she is calling to speak to rn    Please call her in the am per pt

## 2017-08-06 ENCOUNTER — Encounter: Payer: Self-pay | Admitting: Cardiovascular Disease

## 2017-08-06 ENCOUNTER — Ambulatory Visit: Payer: Medicaid Other | Admitting: Cardiovascular Disease

## 2017-08-06 DIAGNOSIS — R079 Chest pain, unspecified: Secondary | ICD-10-CM

## 2017-08-06 NOTE — Patient Instructions (Signed)
Medication Instructions: Your physician recommends that you continue on your current medications as directed. Please refer to the Current Medication list given to you today.   Follow-Up: Your physician recommends that you schedule a follow-up appointment as needed with Dr. Berry.    

## 2017-08-06 NOTE — Progress Notes (Signed)
08/06/2017 Heidi Kelly Rill   07-04-1981  [id number]  Primary Physician System, Pcp Not In Primary Cardiologist: Runell GessJonathan J Rubbie Goostree MD Milagros LollFACP, FACC, [place], [place]  HPI:  Heidi Kelly Bonelli is a 36 y.o. moderately overweight smoker patient felt females coming back to her children today. She was last seen in the office by Theodore Demarkhonda Barrett PA-C 10/31/16. She was complaining of atypical chest. That time with risk factors included tobacco abuse and family history. She had a coronary CTA performed on 11/26/16 which was entirely normal. Coronary calcium score of 0 and completely normal coronary arteries. She continues to have atypical chest pain.    Current Meds  Medication Sig  . alprazolam (XANAX) 2 MG tablet Take 2 mg by mouth 3 (three) times daily.  Marland Kitchen. lamoTRIgine (LAMICTAL) 100 MG tablet Take 200 mg by mouth 2 (two) times daily.  . meloxicam (MOBIC) 7.5 MG tablet Take 7.5 mg by mouth daily.  Marland Kitchen. omeprazole (PRILOSEC) 20 MG capsule Take 20 mg by mouth daily.  . pregabalin (LYRICA) 200 MG capsule Take 200 mg by mouth 3 (three) times daily.  . sertraline (ZOLOFT) 50 MG tablet Take 75 mg by mouth daily.  Marland Kitchen. topiramate (TOPAMAX) 50 MG tablet Take 50 mg by mouth 2 (two) times daily.     No Known Allergies  Social History   Socioeconomic History  . Marital status: Legally Separated    Spouse name: Not on file  . Number of children: Not on file  . Years of education: Not on file  . Highest education level: Not on file  Social Needs  . Financial resource strain: Not on file  . Food insecurity - worry: Not on file  . Food insecurity - inability: Not on file  . Transportation needs - medical: Not on file  . Transportation needs - non-medical: Not on file  Occupational History  . Occupation: [employer]  Tobacco Use  . Smoking status: Current Every Day Smoker    Packs/day: 0.50    Types: Cigarettes  . Smokeless tobacco: Never Used  Substance and Sexual Activity  . Alcohol use: No  . Drug use:  No  . Sexual activity: Yes    Birth control/protection: Other-see comments    Comment: tubes are tied  Other Topics Concern  . Not on file  Social History Narrative  . Not on file     Review of Systems: General: negative for chills, fever, night sweats or weight changes.  Cardiovascular: negative for chest pain, dyspnea on exertion, edema, orthopnea, palpitations, paroxysmal nocturnal dyspnea or shortness of breath Dermatological: negative for rash Respiratory: negative for cough or wheezing Urologic: negative for hematuria Abdominal: negative for nausea, vomiting, diarrhea, bright red blood per rectum, melena, or hematemesis Neurologic: negative for visual changes, syncope, or dizziness All other systems reviewed and are otherwise negative except as noted above.    Blood pressure 102/70, pulse 64, height 5\' 7"  (1.702 m), weight 199 lb 12.8 oz (90.6 kg).  General appearance: alert and no distress Neck: no adenopathy, no carotid bruit, no JVD, supple, symmetrical, trachea midline and thyroid not enlarged, symmetric, no tenderness/mass/nodules Lungs: clear to auscultation bilaterally Heart: regular rate and rhythm, S1, S2 normal, no murmur, click, rub or gallop Extremities: extremities normal, atraumatic, no cyanosis or edema Pulses: 2+ and symmetric Skin: Skin color, texture, turgor normal. No rashes or lesions Neurologic: Alert and oriented X 3, normal strength and tone. Normal symmetric reflexes. Normal coordination and gait  EKG normal sinus rhythm at 64  without ST or T-wave changes. I personally reviewed this EKG.  ASSESSMENT AND PLAN:   Chest pain, moderate coronary artery risk Ms. Delton Seeelson returns for follow-up. She is a 36 year old single mild to moderately overweight Caucasian female culprit by 2 of her children. She was seen by Bjorn Loserhonda in the office 10/31/16. A CT angiogram was performed on 3/1 should've coronary calcium score 0 and bili normal coronary arteries. She has pain  constantly on a daily basis. I've reassured her that this is noncardiac and I will see back on a when necessary basis.      Runell GessJonathan J. Alaira Level MD FACP,FACC,FAHA, [facility] 08/06/2017 11:35 AM

## 2017-08-06 NOTE — Assessment & Plan Note (Signed)
Ms. Heidi Kelly returns for follow-up. She is a 36 year old single mild to moderately overweight Caucasian female culprit by 2 of her children. She was seen by Bjorn Loserhonda in the office 10/31/16. A CT angiogram was performed on 3/1 should've coronary calcium score 0 and bili normal coronary arteries. She has pain constantly on a daily basis. I've reassured her that this is noncardiac and I will see back on a when necessary basis.

## 2018-02-20 ENCOUNTER — Ambulatory Visit: Payer: Medicaid Other | Admitting: Cardiovascular Disease

## 2018-02-20 ENCOUNTER — Encounter: Payer: Self-pay | Admitting: Cardiovascular Disease

## 2018-02-20 VITALS — BP 110/75 | HR 75 | Ht 67.0 in | Wt 213.0 lb

## 2018-02-20 DIAGNOSIS — I5189 Other ill-defined heart diseases: Secondary | ICD-10-CM | POA: Diagnosis not present

## 2018-02-20 DIAGNOSIS — Z720 Tobacco use: Secondary | ICD-10-CM | POA: Diagnosis not present

## 2018-02-20 DIAGNOSIS — Z13220 Encounter for screening for lipoid disorders: Secondary | ICD-10-CM | POA: Diagnosis not present

## 2018-02-20 DIAGNOSIS — R079 Chest pain, unspecified: Secondary | ICD-10-CM | POA: Diagnosis not present

## 2018-02-20 NOTE — Patient Instructions (Signed)
Medication Instructions: Your physician recommends that you continue on your current medications as directed. Please refer to the Current Medication list given to you today.  Labwork: Your physician recommends that you return for a FASTING lipid profile and hepatic function panel at your earliest convenience.   Testing/Procedures: Your physician has requested that you have an echocardiogram. Echocardiography is a painless test that uses sound waves to create images of your heart. It provides your doctor with information about the size and shape of your heart and how well your heart's chambers and valves are working. This procedure takes approximately one hour. There are no restrictions for this procedure.  Follow-Up: Your physician wants you to follow-up in: 1 year with an APP. You will receive a reminder letter in the mail two months in advance. If you don't receive a letter, please call our office to schedule the follow-up appointment.  If you need a refill on your cardiac medications before your next appointment, please call your pharmacy.

## 2018-02-20 NOTE — Assessment & Plan Note (Signed)
History of atypical chest pain cardiac risk factors that include tobacco abuse.  She did have a coronary CTA performed 11/26/2016 which was entirely normal without evidence of CAD and a coronary calcium score of 0.

## 2018-02-20 NOTE — Progress Notes (Signed)
02/20/2018 Heidi BaileyBrandy Kelly   Feb 20, 1981  [id number]  Primary Physician Elder NegusSanders, David, PA-C Primary Cardiologist: Runell GessJonathan J [name] MD FACP, [place]ACC, [place], [place]  HPI:  Heidi Kelly is a 37 y.o.  moderately overweight Caucasian female who I last saw in the office 08/06/2017.  She was complaining of atypical chest.  Her cardiovascular risk factors include tobacco abuse and family history. She had a coronary CTA performed on 11/26/16 which was entirely normal. Coronary calcium score of 0 and completely normal coronary arteries. She continues to have atypical chest pain.   She also complains of some chronic fatigue and some swelling with a 2D echo that was performed by her PCP 09/25/2016 that showed normal LV systolic function, LVH with grade 3 diastolic dysfunction.  She is on a low-dose diuretic.     Current Meds  Medication Sig  . alprazolam (XANAX) 2 MG tablet Take 2 mg by mouth 3 (three) times daily.  . cyclobenzaprine (FLEXERIL) 10 MG tablet Take 10 mg by mouth 2 (two) times daily as needed.  . hydrochlorothiazide (HYDRODIURIL) 25 MG tablet Take 25 mg by mouth daily.  Marland Kitchen. HYDROcodone-acetaminophen (NORCO) 10-325 MG tablet Take 1 tablet by mouth every 6 (six) hours as needed.  . lamoTRIgine (LAMICTAL) 100 MG tablet Take 200 mg by mouth 2 (two) times daily.  . montelukast (SINGULAIR) 10 MG tablet Take 10 mg by mouth at bedtime.  . Multiple Vitamins-Minerals (ONE DAILY MULTIVITAMIN WOMEN) TABS Take 1 tablet by mouth daily.  . ondansetron (ZOFRAN) 8 MG tablet Take 8 mg by mouth 2 (two) times daily.  . pregabalin (LYRICA) 200 MG capsule Take 200 mg by mouth 3 (three) times daily.  . sertraline (ZOLOFT) 50 MG tablet Take 75 mg by mouth daily.  Marland Kitchen. topiramate (TOPAMAX) 100 MG tablet Take 150 mg by mouth 2 (two) times daily.      No Known Allergies  Social History   Socioeconomic History  . Marital status: Legally Separated    Spouse name: Not on file  . Number of children: Not on file  .  Years of education: Not on file  . Highest education level: Not on file  Occupational History  . 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 Financial resource strain: Not on file  . Food insecurity:    Worry: Not on file    Inability: Not on file  . Transportation needs:    Medical: Not on file    Non-medical: Not on file  Tobacco Use  . Smoking status: Current Every Day Smoker    Packs/day: 0.50    Types: Cigarettes  . Smokeless tobacco: Never Used  Substance and Sexual Activity  . Alcohol use: No  . Drug use: No  . Sexual activity: Yes    Birth control/protection: Other-see comments    Comment: tubes are tied  Lifestyle  . Physical activity:    Days per week: Not on file    Minutes per session: Not on file  . Stress: Not on file  Relationships  . Social connections:    Talks on phone: Not on file    Gets together: Not on file    Attends religious service: Not on file    Active member of club or organization: Not on file    Attends meetings of clubs or organizations: Not on file    Relationship status: Not on file  . Intimate partner violence:    Fear of current or ex partner: Not on file  Emotionally abused: Not on file    Physically abused: Not on file    Forced sexual activity: Not on file  Other Topics Concern  . Not on file  Social History Narrative  . Not on file     Review of Systems: General: negative for chills, fever, night sweats or weight changes.  Cardiovascular: negative for chest pain, dyspnea on exertion, edema, orthopnea, palpitations, paroxysmal nocturnal dyspnea or shortness of breath Dermatological: negative for rash Respiratory: negative for cough or wheezing Urologic: negative for hematuria Abdominal: negative for nausea, vomiting, diarrhea, bright red blood per rectum, melena, or hematemesis Neurologic: negative for visual changes, syncope, or dizziness All other systems reviewed and are otherwise negative except as noted  above.    Blood pressure 110/75, pulse 75, height 5\' 7"  (1.702 m), weight 213 lb (96.6 kg).  General appearance: alert and no distress Neck: no adenopathy, no carotid bruit, no JVD, supple, symmetrical, trachea midline and thyroid not enlarged, symmetric, no tenderness/mass/nodules Lungs: clear to auscultation bilaterally Heart: regular rate and rhythm, S1, S2 normal, no murmur, click, rub or gallop Extremities: extremities normal, atraumatic, no cyanosis or edema Pulses: 2+ and symmetric Skin: Skin color, texture, turgor normal. No rashes or lesions Neurologic: Alert and oriented X 3, normal strength and tone. Normal symmetric reflexes. Normal coordination and gait  EKG sinus rhythm at 75 without ST or T wave changes.  I personally reviewed this EKG.  ASSESSMENT AND PLAN:   Chest pain, moderate coronary artery risk History of atypical chest pain cardiac risk factors that include tobacco abuse.  She did have a coronary CTA performed 11/26/2016 which was entirely normal without evidence of CAD and a coronary calcium score of 0.  Tobacco abuse Ongoing tobacco abuse recalcitrant to risk factor modification.  Diastolic dysfunction 2D echo performed by her EP 09/25/2016 revealed normal LV systolic function, left ventricular hypertrophy with grade 3 diastolic dysfunction.  She does complain of some swelling.  She is aware of salt avoidance and is on a low-dose diuretic.  I am going to repeat her 2D echocardiogram      Runell Gess MD [facility], [facility] 02/20/2018 11:21 AM

## 2018-02-20 NOTE — Assessment & Plan Note (Signed)
2D echo performed by her EP 09/25/2016 revealed normal LV systolic function, left ventricular hypertrophy with grade 3 diastolic dysfunction.  She does complain of some swelling.  She is aware of salt avoidance and is on a low-dose diuretic.  I am going to repeat her 2D echocardiogram

## 2018-02-20 NOTE — Assessment & Plan Note (Signed)
Ongoing tobacco abuse recalcitrant to risk factor modification. 

## 2018-02-26 ENCOUNTER — Ambulatory Visit (HOSPITAL_COMMUNITY): Payer: Medicaid Other

## 2018-03-04 ENCOUNTER — Other Ambulatory Visit (HOSPITAL_COMMUNITY): Payer: Medicaid Other

## 2018-04-01 ENCOUNTER — Encounter (HOSPITAL_COMMUNITY): Payer: Self-pay | Admitting: Radiology

## 2018-04-06 ENCOUNTER — Encounter: Payer: Self-pay | Admitting: *Deleted

## 2018-08-23 IMAGING — DX DG CHEST 2V
2 series · 2 of 2 positions shown · non-contrast
Comparison: 10/22/2016

CLINICAL DATA: Generalized pain with cough and shortness of breath

EXAM:
CHEST  2 VIEW

[chest pa]
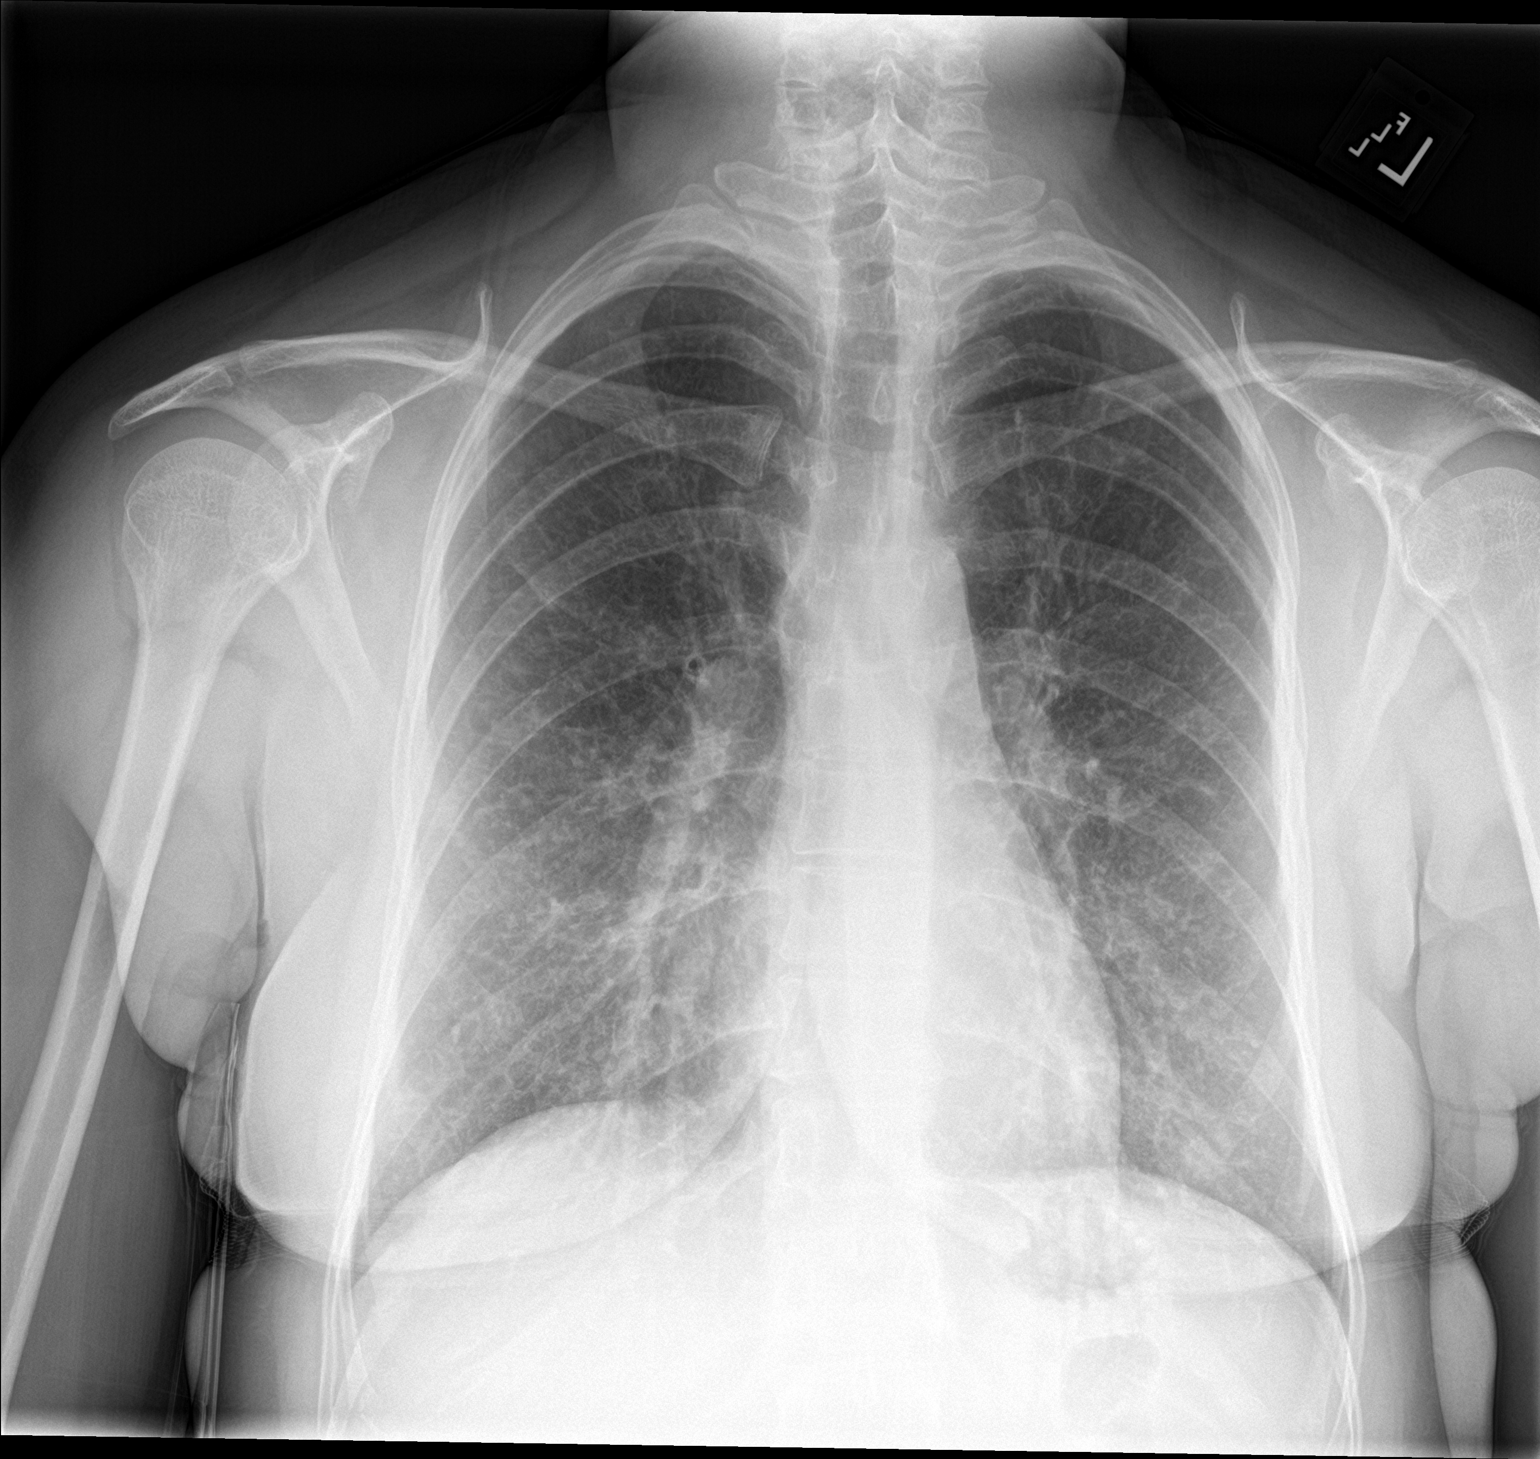

[chest lat]
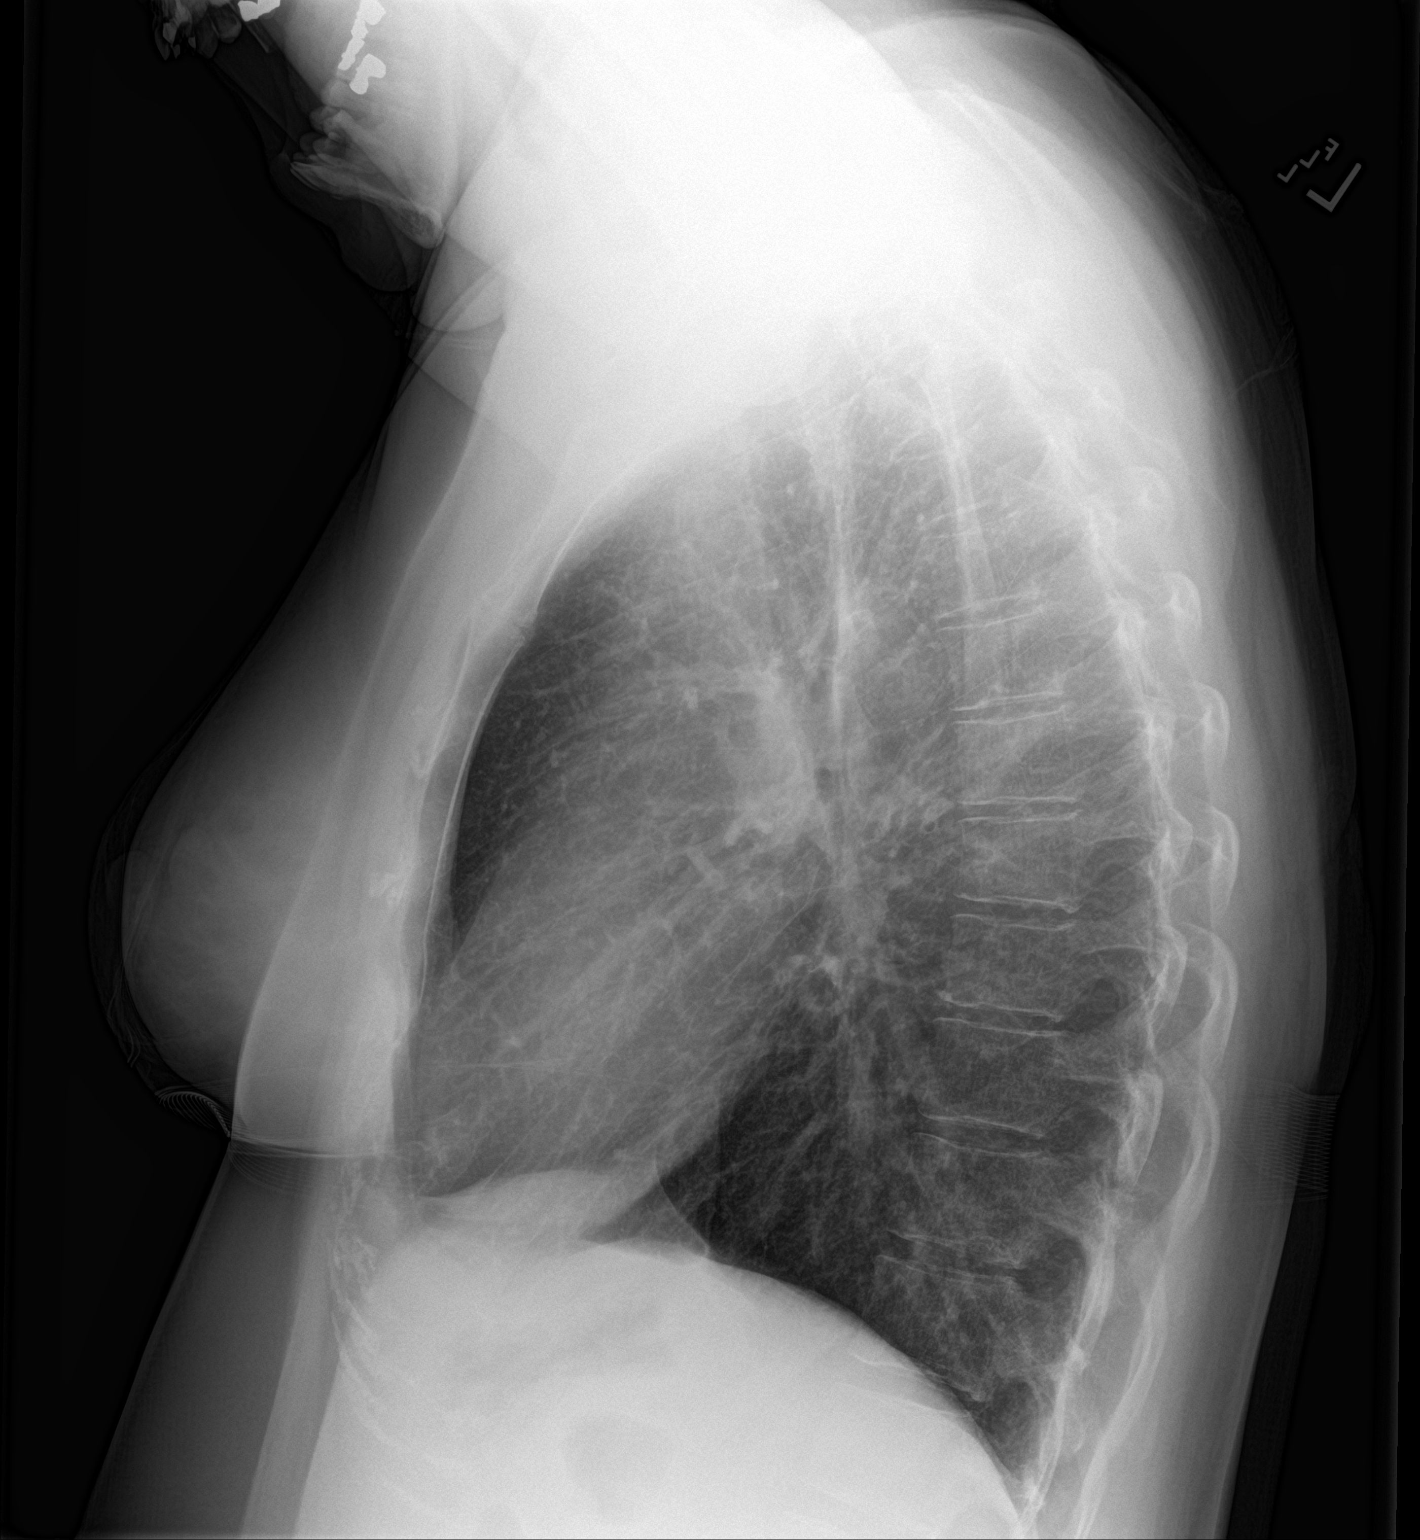

[2 of 2 positions shown; findings below may reference images not displayed]

FINDINGS: Mild hyperinflation. No focal pulmonary infiltrate, consolidation or
pleural effusion. Normal cardiomediastinal silhouette. No
pneumothorax.
IMPRESSION: No focal infiltrate or edema

## 2021-02-15 ENCOUNTER — Encounter: Payer: Self-pay | Admitting: Physical Therapy

## 2021-02-15 ENCOUNTER — Ambulatory Visit: Payer: Medicaid Other | Attending: Physical Medicine and Rehabilitation | Admitting: Physical Therapy

## 2021-02-15 ENCOUNTER — Other Ambulatory Visit: Payer: Self-pay

## 2021-02-15 DIAGNOSIS — M542 Cervicalgia: Secondary | ICD-10-CM

## 2021-02-15 DIAGNOSIS — R293 Abnormal posture: Secondary | ICD-10-CM | POA: Insufficient documentation

## 2021-02-15 NOTE — Therapy (Signed)
[facility] [address] [place], [place], [address] Phone: [phone]   Fax:  [phone]  Physical Therapy Evaluation  Patient Details  Name: Heidi Kelly MRN: [id number] Date of Birth: [birth year] Referring Provider (PT): Ellwood Sayers   Encounter Date: 02/15/2021   PT End of Session - 02/15/21 1424     Visit Number 1    Number of Visits 12    Date for PT Re-Evaluation 03/29/21    PT Start Time 0148    PT Stop Time 0213    PT Time Calculation (min) 25 min    Activity Tolerance Patient tolerated treatment well    Behavior During Therapy [facility] for tasks assessed/performed             Past Medical History:  Diagnosis Date   Anxiety    Arthritis    Bronchitis    Chronic back pain    Chronic pain disorder    Depression    GERD (gastroesophageal reflux disease)    Hypertension    Migraines    Non compliance w medication regimen    Seizure (HCC)    simce she was 20   Seizures (HCC)    Thoracic back pain    Tobacco abuse     Past Surgical History:  Procedure Laterality Date   CESAREAN SECTION     x 3   KNEE ARTHROPLASTY Right    TUBAL LIGATION      There were no vitals filed for this visit.    Subjective Assessment - 02/15/21 1426     Subjective COVID-19 screen performed prior to patient entering clinic.  The patient presents to the clinic today with c/o chronic neck and midback pain that has been ongoing for several years.  Her pain is rated at a 8/10 today and can rise to a 10 after prolonged standing and sitting.  She also has daily headaches.  Lying down and resting can decrease her pain.    Pertinent History Migraines, OA, right TKA, GERD, chronic back pain, HTN, seizures, cervical vertebral fracture years ago (patient report), jaw surgery.    How long can you sit comfortably? ~15 minutes.    How long can you stand comfortably? ~15 minutes.    How long can you walk comfortably? Varies.    Patient Stated  Goals Reduce pain.    Currently in Pain? Yes    Pain Score 8     Pain Location Neck    Pain Orientation Right;Left    Pain Descriptors / Indicators Shooting;Throbbing;Numbness;Sharp    Pain Type Chronic pain    Pain Onset More than a month ago    Pain Frequency Constant    Aggravating Factors  See above.    Pain Relieving Factors See above.                [facility] PT Assessment - 02/15/21 0001       Assessment   Medical Diagnosis Neck pain    Referring Provider (PT) Ellwood Sayers    Onset Date/Surgical Date --   "Several years".     Precautions   Precautions None      Restrictions   Weight Bearing Restrictions No      Balance Screen   Has the patient fallen in the past 6 months No    Has the patient had a decrease in activity level because of a fear of falling?  No    Is the patient reluctant to leave their home because of  a fear of falling?  No      Home Environment   Living Environment Private residence      Prior Function   Level of Independence Independent      Posture/Postural Control   Posture/Postural Control Postural limitations    Postural Limitations Rounded Shoulders;Forward head      Deep Tendon Reflexes   DTR Assessment Site Biceps;Brachioradialis;Triceps    Biceps DTR 0    Brachioradialis DTR 0    Triceps DTR 0      ROM / Strength   AROM / PROM / Strength AROM;Strength      AROM   Overall AROM Comments Bilateral active cervical rotation is 45 degrees.      Strength   Overall Strength Comments Normal UE strength.      Palpation   Palpation comment C/o diffuse palpable tenderness from suboccipital musculature, bilateral cervical paraspinals, UT's (with a notable increase in tone) and scapular retractor musculature.      Ambulation/Gait   Gait Comments WNL.                        Objective measurements completed on examination: See above findings.                    PT Long Term Goals - 02/15/21 1655        PT LONG TERM GOAL #1   Title Independent with a HEP.    Baseline No knowledge of appropriate ther ex.    Time 6    Period Weeks    Status New      PT LONG TERM GOAL #2   Title Increase active cervical rotation to 70 degrees+ so patient can turn head more easily while driving.    Baseline Bilateral active cervical rotation is 45 degrees.    Time 6    Period Weeks    Status New      PT LONG TERM GOAL #3   Title Decrease headaches to no more than 2 per week with a 50% decrease in intensity.    Time 6    Period Weeks    Status New      PT LONG TERM GOAL #4   Title Sit 30 minutes with pain not > 4/10.    Baseline Pain can rise to a 10/10 with sitting longer than 15 minutes.    Period Weeks    Status New                    Plan - 02/15/21 1646     Clinical Impression Statement The patient presents to OPPT with c/o chronic neck pain that has been ongoing for several years.  She was found to have a significant loss of active cervical rotation and c/o diffuse cervical and thoracic back pain.  She states she can not sit and stand for long periods of time and must change positions often.  She c/o dailiy headaches and does experience pain into both UE's.  Patient will benefit from skilled physical therapy intervention to address pain and deficits.    Personal Factors and Comorbidities Comorbidity 1;Comorbidity 2;Other    Comorbidities Migraines, OA, right TKA, GERD, chronic back pain, HTN, seizures, cervical vertebral fracture years ago (patient report), jaw surgery.    Examination-Activity Limitations Other;Sit;Stand    Examination-Participation Restrictions Other    Stability/Clinical Decision Making Stable/Uncomplicated    Clinical Decision Making Low    Rehab Potential Good  PT Frequency 2x / week    PT Duration 6 weeks    PT Treatment/Interventions ADLs/Self Care Home Management;Cryotherapy;Electrical Stimulation;Ultrasound;Moist Heat;Therapeutic  activities;Therapeutic exercise;Manual techniques;Patient/family education;Passive range of motion    PT Next Visit Plan Postural exercises, modalities and STW/M, suboccipital release.    Consulted and Agree with Plan of Care Patient             Patient will benefit from skilled therapeutic intervention in order to improve the following deficits and impairments:  Decreased activity tolerance, Decreased range of motion, Increased muscle spasms, Postural dysfunction, Pain  Visit Diagnosis: Cervicalgia - Plan: PT plan of care cert/re-cert  Abnormal posture - Plan: PT plan of care cert/re-cert     Problem List Patient Active Problem List   Diagnosis Date Noted   Tobacco abuse 02/20/2018   Diastolic dysfunction 02/20/2018   Chest pain, moderate coronary artery risk 10/31/2016    Heidi Kelly, [place] MPT 02/15/2021, 5:07 PM  [facility] [address] [place], [place], [address] Phone: [phone]   Fax:  [phone]  Name: Heidi Kelly MRN: [id number] Date of Birth: [birth year]

## 2021-02-27 ENCOUNTER — Encounter: Payer: Self-pay | Admitting: Physical Therapy

## 2021-02-27 ENCOUNTER — Ambulatory Visit: Payer: Medicaid Other | Admitting: Physical Therapy

## 2021-02-27 ENCOUNTER — Other Ambulatory Visit: Payer: Self-pay

## 2021-02-27 DIAGNOSIS — M542 Cervicalgia: Secondary | ICD-10-CM | POA: Diagnosis not present

## 2021-02-27 DIAGNOSIS — R293 Abnormal posture: Secondary | ICD-10-CM

## 2021-02-27 NOTE — Therapy (Signed)
[facility] [address] [place], [place], [address] Phone: [phone]   Fax:  [phone]  Physical Therapy Treatment  Patient Details  Name: Heidi Kelly MRN: [id number] Date of Birth: [birth year] Referring Provider (PT): Ellwood Sayers   Encounter Date: 02/27/2021   PT End of Session - 02/27/21 1605     Visit Number 2    Number of Visits 12    Date for PT Re-Evaluation 03/29/21    PT Start Time 1605    PT Stop Time 1645    PT Time Calculation (min) 40 min    Activity Tolerance Patient tolerated treatment well    Behavior During Therapy WFL for tasks assessed/performed             Past Medical History:  Diagnosis Date   Anxiety    Arthritis    Bronchitis    Chronic back pain    Chronic pain disorder    Depression    GERD (gastroesophageal reflux disease)    Hypertension    Migraines    Non compliance w medication regimen    Seizure (HCC)    simce she was 20   Seizures (HCC)    Thoracic back pain    Tobacco abuse     Past Surgical History:  Procedure Laterality Date   CESAREAN SECTION     x 3   KNEE ARTHROPLASTY Right    TUBAL LIGATION      There were no vitals filed for this visit.   Subjective Assessment - 02/27/21 1602     Subjective COVID 19 screening performed upon arrival. More pain over the last month.    Pertinent History Migraines, OA, right TKA, GERD, chronic back pain, HTN, seizures, cervical vertebral fracture years ago (patient report), jaw surgery.    How long can you sit comfortably? ~15 minutes.    How long can you stand comfortably? ~15 minutes.    How long can you walk comfortably? Varies.    Patient Stated Goals Reduce pain.    Currently in Pain? Yes    Pain Score 8     Pain Location Neck    Pain Orientation Left;Right    Pain Descriptors / Indicators Aching;Stabbing;Numbness    Pain Type Chronic pain    Pain Onset More than a month ago    Pain Frequency Constant                 [facility] PT Assessment - 02/27/21 0001       Assessment   Medical Diagnosis Neck pain    Referring Provider (PT) Ellwood Sayers      Precautions   Precautions None      Restrictions   Weight Bearing Restrictions No                           [facility] Adult PT Treatment/Exercise - 02/27/21 0001       Modalities   Modalities Electrical Stimulation;Moist Heat;Ultrasound      Moist Heat Therapy   Number Minutes Moist Heat 15 Minutes    Moist Heat Location Cervical      Electrical Stimulation   Electrical Stimulation Location B UT/levator scapula    Electrical Stimulation Action Pre-mod    Electrical Stimulation Parameters 80-150 hz x15 min    Electrical Stimulation Goals Pain;Tone      Ultrasound   Ultrasound Location B UT/cervical paraspinals    Ultrasound Parameters Combo 1.5 w/cm2, 100%, 1 mhz  x10 min    Ultrasound Goals Pain      Manual Therapy   Manual Therapy Soft tissue mobilization    Soft tissue mobilization STW/TPR to B UT, levator scapula, cervical paraspinals, suboccipitals to reduce tone and pain                         PT Long Term Goals - 02/15/21 1655       PT LONG TERM GOAL #1   Title Independent with a HEP.    Baseline No knowledge of appropriate ther ex.    Time 6    Period Weeks    Status New      PT LONG TERM GOAL #2   Title Increase active cervical rotation to 70 degrees+ so patient can turn head more easily while driving.    Baseline Bilateral active cervical rotation is 45 degrees.    Time 6    Period Weeks    Status New      PT LONG TERM GOAL #3   Title Decrease headaches to no more than 2 per week with a 50% decrease in intensity.    Time 6    Period Weeks    Status New      PT LONG TERM GOAL #4   Title Sit 30 minutes with pain not > 4/10.    Baseline Pain can rise to a 10/10 with sitting longer than 15 minutes.    Period Weeks    Status New                   Plan - 02/27/21 1644      Clinical Impression Statement Patient presented in clinic with increased cervical and R mid thoracic pain. Patient presented with reports of increased cases of migraines due to pain. Patient works two jobs and denies lifting anymore than 10lbs which is intermittant as well. Patient able to tolerate STW well with some reports of tenderness and soreness over muscle groups during manual therapy session. Patient provided a handout for home TENS unit. Normal modalities response noted following removal of the modalities.    Personal Factors and Comorbidities Comorbidity 1;Comorbidity 2;Other    Comorbidities Migraines, OA, right TKA, GERD, chronic back pain, HTN, seizures, cervical vertebral fracture years ago (patient report), jaw surgery.    Examination-Activity Limitations Other;Sit;Stand    Examination-Participation Restrictions Other    Stability/Clinical Decision Making Stable/Uncomplicated    Rehab Potential Good    PT Frequency 2x / week    PT Duration 6 weeks    PT Treatment/Interventions ADLs/Self Care Home Management;Cryotherapy;Electrical Stimulation;Ultrasound;Moist Heat;Therapeutic activities;Therapeutic exercise;Manual techniques;Patient/family education;Passive range of motion    PT Next Visit Plan Postural exercises, modalities and STW/M, suboccipital release.    Consulted and Agree with Plan of Care Patient             Patient will benefit from skilled therapeutic intervention in order to improve the following deficits and impairments:  Decreased activity tolerance, Decreased range of motion, Increased muscle spasms, Postural dysfunction, Pain  Visit Diagnosis: Cervicalgia  Abnormal posture     Problem List Patient Active Problem List   Diagnosis Date Noted   Tobacco abuse 02/20/2018   Diastolic dysfunction 02/20/2018   Chest pain, moderate coronary artery risk 10/31/2016    Marvell Fuller, PTA 02/27/2021, 4:57 PM  [facility] [address] [place], [place], [address] Phone: [phone]   Fax:  [phone]  Name: Heidi  Kelly MRN: [id number] Date of Birth: 18-May-1981

## 2021-03-06 ENCOUNTER — Ambulatory Visit: Payer: Medicaid Other | Admitting: Physical Therapy

## 2021-03-08 ENCOUNTER — Other Ambulatory Visit: Payer: Self-pay

## 2021-03-08 ENCOUNTER — Ambulatory Visit: Payer: Medicaid Other | Attending: Physical Medicine and Rehabilitation | Admitting: Physical Therapy

## 2021-03-08 DIAGNOSIS — R293 Abnormal posture: Secondary | ICD-10-CM

## 2021-03-08 DIAGNOSIS — M542 Cervicalgia: Secondary | ICD-10-CM | POA: Diagnosis present

## 2021-03-08 NOTE — Patient Instructions (Signed)
AROM: Neck Rotation   Turn head slowly to look over one shoulder, then the other. Hold each position _10___ seconds. Repeat _5___ times per set. Do __2__ sets per session. Do _2-3___ sessions per day.  http://orth.exer.us/294   Copyright  VHI. All rights reserved.  AROM: Lateral Neck Flexion   Slowly tilt head toward one shoulder, then the other. Hold each position _10___ seconds. Repeat __5__ times per set. Do __2__ sets per session. Do __2-3__ sessions per day.  http://orth.exer.us/296   Copyright  VHI. All rights reserved.  Stretch Break - Chin Tuck   Looking straight forward, tuck chin and hold __10__ seconds. Relax and return to starting position. Repeat __5-10__ times every _3-4___ hours.  Copyright  VHI. All rights reserved.  Stretch Break - Chest and Shoulder Stretch   Maintaining erect posture, draw shoulders back while bringing elbows back and inward. Return to starting position. Repeat __10-20__ times every _3-4___ hours.

## 2021-03-08 NOTE — Therapy (Addendum)
[facility] [place], [place], [address] Phone: [phone]   Fax:  [phone]  Physical Therapy Treatment  Patient Details  Name: Heidi Kelly MRN: [id number] Date of Birth: [birth year] Referring Provider (PT): Almyra Free   Encounter Date: 03/08/2021   PT End of Session - 03/08/21 1537     Visit Number 3    Number of Visits 12    Date for PT Re-Evaluation 03/29/21    PT Start Time 0321    PT Stop Time 0358    PT Time Calculation (min) 37 min    Activity Tolerance Patient tolerated treatment well    Behavior During Therapy [facility] for tasks assessed/performed             Past Medical History:  Diagnosis Date   Anxiety    Arthritis    Bronchitis    Chronic back pain    Chronic pain disorder    Depression    GERD (gastroesophageal reflux disease)    Hypertension    Migraines    Non compliance w medication regimen    Seizure ([place])    simce she was 20   Seizures ([place])    Thoracic back pain    Tobacco abuse     Past Surgical History:  Procedure Laterality Date   CESAREAN SECTION     x 3   KNEE ARTHROPLASTY Right    TUBAL LIGATION      There were no vitals filed for this visit.   Subjective Assessment - 03/08/21 1535     Subjective COVID 19 screening performed upon arrival. Patient arrived with some ongoing pain, some relief after last treatment.    Pertinent History Migraines, OA, right TKA, GERD, chronic back pain, HTN, seizures, cervical vertebral fracture years ago (patient report), jaw surgery.    How long can you sit comfortably? ~15 minutes.    How long can you stand comfortably? ~15 minutes.    How long can you walk comfortably? Varies.    Patient Stated Goals Reduce pain.    Currently in Pain? Yes    Pain Score 7     Pain Location Neck    Pain Orientation Right;Left    Pain Descriptors / Indicators Discomfort;Aching    Pain Type Chronic pain    Pain Onset More than a month ago     Pain Frequency Constant    Aggravating Factors  movement    Pain Relieving Factors rest                [facility] PT Assessment - 03/08/21 0001       AROM   AROM Assessment Site Cervical    Cervical - Right Rotation 54    Cervical - Left Rotation 55                           [facility] Adult PT Treatment/Exercise - 03/08/21 0001       Exercises   Exercises Neck      Neck Exercises: Seated   Cervical Isometrics Flexion;Extension;Right lateral flexion;Left lateral flexion;5 secs;5 reps    Neck Retraction 20 reps    Cervical Rotation Both;5 reps    Lateral Flexion Both;5 reps    W Back 20 reps      Moist Heat Therapy   Number Minutes Moist Heat 10 Minutes    Moist Heat Location Cervical      Electrical Stimulation   Electrical Stimulation Location B  UT/levator scapula    Electrical Stimulation Action premod    Electrical Stimulation Parameters 80-_0  x58mn    Electrical Stimulation Goals Pain;Tone      Ultrasound   Ultrasound Location Bil cervical/UT    Ultrasound Parameters combo US/ES _1 .5w/cm2/100%/134m x1270m                   PT Education - 03/08/21 1540     Education Details HEP    Person(s) Educated Patient    Methods Explanation;Demonstration;Handout    Comprehension Verbalized understanding;Returned demonstration                 PT Long Term Goals - 03/08/21 1537       PT LONG TERM GOAL #1   Title Independent with a HEP.    Baseline No knowledge of appropriate ther ex.    Time 6    Period Weeks    Status On-going      PT LONG TERM GOAL #2   Title Increase active cervical rotation to 70 degrees+ so patient can turn head more easily while driving.    Time 6    Period Weeks    Status On-going      PT LONG TERM GOAL #3   Title Decrease headaches to no more than 2 per week with a 50% decrease in intensity.    Baseline ongoing 03/08/21    Time 6    Period Weeks    Status On-going      PT LONG TERM GOAL #4    Title Sit 30 minutes with pain not > 4/10.    Baseline Pain up to 6-7/10 10-15 min 03/08/21    Period Weeks    Status On-going                   Plan - 03/08/21 1550     Clinical Impression Statement Patient tolerated treatment fair today due to 7/10 pain reported. Patient able to progress with gentle cervical exercises and mobility with HEP provided. Patient has improved with bil cervical rotation yet some ongoing limitations. Patient has daily headaches and pain with prolong sitting. Goals ongoing this week. Felt better after treatment.    Personal Factors and Comorbidities Comorbidity 1;Comorbidity 2;Other    Comorbidities Migraines, OA, right TKA, GERD, chronic back pain, HTN, seizures, cervical vertebral fracture years ago (patient report), jaw surgery.    Examination-Activity Limitations Other;Sit;Stand    Examination-Participation Restrictions Other    Stability/Clinical Decision Making Stable/Uncomplicated    Rehab Potential Good    PT Frequency 2x / week    PT Duration 6 weeks    PT Treatment/Interventions ADLs/Self Care Home Management;Cryotherapy;Electrical Stimulation;Ultrasound;Moist Heat;Therapeutic activities;Therapeutic exercise;Manual techniques;Patient/family education;Passive range of motion    PT Next Visit Plan Cont with POC for Postural exercises, modalities and STW/M, suboccipital release.    Consulted and Agree with Plan of Care Patient             Patient will benefit from skilled therapeutic intervention in order to improve the following deficits and impairments:  Decreased activity tolerance, Decreased range of motion, Increased muscle spasms, Postural dysfunction, Pain  Visit Diagnosis: Abnormal posture  Cervicalgia     Problem List Patient Active Problem List   Diagnosis Date Noted   Tobacco abuse 06/63/33/5456Diastolic dysfunction 02/24/62/8937Chest pain, moderate coronary artery risk 10/31/2016    Davit Vassar P, PTA 03/08/2021,  4:00 PM  [facility] [address]  [place], [place], [address] Phone: [phone]   Fax:  [phone]  Name: Heidi Kelly MRN: [id number] Date of Birth: 1980-10-01   PHYSICAL THERAPY DISCHARGE SUMMARY  Visits from Start of Care: 3.  Current functional level related to goals / functional outcomes: See above.   Remaining deficits: See below.   Education / Equipment: HEP.   Patient agrees to discharge. Patient goals were not met. Patient is being discharged due to not returning since the last visit.    Mali Applegate MPT

## 2021-03-13 ENCOUNTER — Ambulatory Visit: Payer: Medicaid Other | Admitting: *Deleted

## 2021-04-03 ENCOUNTER — Ambulatory Visit: Payer: Medicaid Other | Admitting: Physical Therapy

## 2021-04-03 ENCOUNTER — Telehealth: Payer: Self-pay | Admitting: Physical Therapy

## 2021-04-03 NOTE — Telephone Encounter (Signed)
Pt l/m she [facility] to [facility] and they did a covid test. She wanted to cx today and she will call back when she get the test results to r/s.

## 2021-06-13 ENCOUNTER — Ambulatory Visit: Payer: Medicaid Other | Admitting: Physical Therapy

## 2023-02-03 DIAGNOSIS — M25512 Pain in left shoulder: Secondary | ICD-10-CM | POA: Diagnosis not present

## 2023-02-03 DIAGNOSIS — M50323 Other cervical disc degeneration at C6-C7 level: Secondary | ICD-10-CM | POA: Diagnosis not present

## 2023-02-03 DIAGNOSIS — M19012 Primary osteoarthritis, left shoulder: Secondary | ICD-10-CM | POA: Diagnosis not present

## 2023-02-03 DIAGNOSIS — R0781 Pleurodynia: Secondary | ICD-10-CM | POA: Diagnosis not present

## 2023-02-03 DIAGNOSIS — R079 Chest pain, unspecified: Secondary | ICD-10-CM | POA: Diagnosis not present

## 2023-02-03 DIAGNOSIS — M542 Cervicalgia: Secondary | ICD-10-CM | POA: Diagnosis not present

## 2023-02-03 DIAGNOSIS — R0789 Other chest pain: Secondary | ICD-10-CM | POA: Diagnosis not present

## 2023-02-04 DIAGNOSIS — J449 Chronic obstructive pulmonary disease, unspecified: Secondary | ICD-10-CM | POA: Diagnosis not present

## 2023-02-04 DIAGNOSIS — G479 Sleep disorder, unspecified: Secondary | ICD-10-CM | POA: Diagnosis not present

## 2023-02-04 DIAGNOSIS — M4800 Spinal stenosis, site unspecified: Secondary | ICD-10-CM | POA: Diagnosis not present

## 2023-02-04 DIAGNOSIS — M542 Cervicalgia: Secondary | ICD-10-CM | POA: Diagnosis not present

## 2023-02-04 DIAGNOSIS — Z79899 Other long term (current) drug therapy: Secondary | ICD-10-CM | POA: Diagnosis not present

## 2023-02-18 DIAGNOSIS — J449 Chronic obstructive pulmonary disease, unspecified: Secondary | ICD-10-CM | POA: Diagnosis not present

## 2023-02-18 DIAGNOSIS — M5136 Other intervertebral disc degeneration, lumbar region: Secondary | ICD-10-CM | POA: Diagnosis not present

## 2023-02-18 DIAGNOSIS — G4710 Hypersomnia, unspecified: Secondary | ICD-10-CM | POA: Diagnosis not present

## 2023-02-18 DIAGNOSIS — F419 Anxiety disorder, unspecified: Secondary | ICD-10-CM | POA: Diagnosis not present

## 2023-02-18 DIAGNOSIS — Z9109 Other allergy status, other than to drugs and biological substances: Secondary | ICD-10-CM | POA: Diagnosis not present

## 2023-02-18 DIAGNOSIS — F17210 Nicotine dependence, cigarettes, uncomplicated: Secondary | ICD-10-CM | POA: Diagnosis not present

## 2023-02-18 DIAGNOSIS — F1120 Opioid dependence, uncomplicated: Secondary | ICD-10-CM | POA: Diagnosis not present

## 2023-03-07 DIAGNOSIS — J441 Chronic obstructive pulmonary disease with (acute) exacerbation: Secondary | ICD-10-CM | POA: Diagnosis not present

## 2023-03-07 DIAGNOSIS — R0981 Nasal congestion: Secondary | ICD-10-CM | POA: Diagnosis not present

## 2023-03-12 DIAGNOSIS — Z79899 Other long term (current) drug therapy: Secondary | ICD-10-CM | POA: Diagnosis not present

## 2023-03-12 DIAGNOSIS — R252 Cramp and spasm: Secondary | ICD-10-CM | POA: Diagnosis not present

## 2023-03-12 DIAGNOSIS — G629 Polyneuropathy, unspecified: Secondary | ICD-10-CM | POA: Diagnosis not present

## 2023-03-12 DIAGNOSIS — F17210 Nicotine dependence, cigarettes, uncomplicated: Secondary | ICD-10-CM | POA: Diagnosis not present

## 2023-03-12 DIAGNOSIS — M4800 Spinal stenosis, site unspecified: Secondary | ICD-10-CM | POA: Diagnosis not present

## 2023-03-12 DIAGNOSIS — M542 Cervicalgia: Secondary | ICD-10-CM | POA: Diagnosis not present

## 2023-03-12 DIAGNOSIS — J449 Chronic obstructive pulmonary disease, unspecified: Secondary | ICD-10-CM | POA: Diagnosis not present

## 2023-03-12 DIAGNOSIS — G479 Sleep disorder, unspecified: Secondary | ICD-10-CM | POA: Diagnosis not present

## 2023-03-12 DIAGNOSIS — M4807 Spinal stenosis, lumbosacral region: Secondary | ICD-10-CM | POA: Diagnosis not present

## 2023-03-12 DIAGNOSIS — M5136 Other intervertebral disc degeneration, lumbar region: Secondary | ICD-10-CM | POA: Diagnosis not present

## 2023-03-12 DIAGNOSIS — M25511 Pain in right shoulder: Secondary | ICD-10-CM | POA: Diagnosis not present
